# Patient Record
Sex: Male | Born: 1995 | Race: Black or African American | Hispanic: No | Marital: Single | Smoking: Never smoker
Health system: Southern US, Community
[De-identification: ages and names within clinical notes are randomized; demographics above are authoritative.]

## PROBLEM LIST (undated history)

## (undated) DIAGNOSIS — M6282 Rhabdomyolysis: Secondary | ICD-10-CM

---

## 2018-03-25 ENCOUNTER — Encounter (HOSPITAL_COMMUNITY): Payer: Self-pay

## 2018-03-25 ENCOUNTER — Other Ambulatory Visit: Payer: Self-pay

## 2018-03-25 ENCOUNTER — Emergency Department (HOSPITAL_COMMUNITY)
Admission: EM | Admit: 2018-03-25 | Discharge: 2018-03-25 | Disposition: A | Discharge status: Home / Self Care | Payer: BLUE CROSS/BLUE SHIELD | Attending: Emergency Medicine | Admitting: Emergency Medicine

## 2018-03-25 ENCOUNTER — Emergency Department (HOSPITAL_COMMUNITY): Payer: BLUE CROSS/BLUE SHIELD

## 2018-03-25 DIAGNOSIS — R079 Chest pain, unspecified: Secondary | ICD-10-CM | POA: Diagnosis present

## 2018-03-25 DIAGNOSIS — E860 Dehydration: Secondary | ICD-10-CM | POA: Insufficient documentation

## 2018-03-25 DIAGNOSIS — R0789 Other chest pain: Secondary | ICD-10-CM | POA: Insufficient documentation

## 2018-03-25 DIAGNOSIS — Z79899 Other long term (current) drug therapy: Secondary | ICD-10-CM | POA: Insufficient documentation

## 2018-03-25 HISTORY — DX: Rhabdomyolysis: M62.82

## 2018-03-25 LAB — CBC
HCT: 44.4 % (ref 39.0–52.0)
Hemoglobin: 14.3 g/dL (ref 13.0–17.0)
MCH: 28.6 pg (ref 26.0–34.0)
MCHC: 32.2 g/dL (ref 30.0–36.0)
MCV: 88.8 fL (ref 80.0–100.0)
Platelets: 322 10*3/uL (ref 150–400)
RBC: 5 MIL/uL (ref 4.22–5.81)
RDW: 12.1 % (ref 11.5–15.5)
WBC: 7.8 10*3/uL (ref 4.0–10.5)
nRBC: 0 % (ref 0.0–0.2)

## 2018-03-25 LAB — COMPREHENSIVE METABOLIC PANEL
ALT: 54 U/L — ABNORMAL HIGH (ref 0–44)
AST: 40 U/L (ref 15–41)
Albumin: 4.3 g/dL (ref 3.5–5.0)
Alkaline Phosphatase: 68 U/L (ref 38–126)
Anion gap: 13 (ref 5–15)
BUN: 11 mg/dL (ref 6–20)
CO2: 23 mmol/L (ref 22–32)
Calcium: 9.4 mg/dL (ref 8.9–10.3)
Chloride: 102 mmol/L (ref 98–111)
Creatinine, Ser: 1.44 mg/dL — ABNORMAL HIGH (ref 0.61–1.24)
GFR calc Af Amer: >60 mL/min (ref >60)
GFR calc non Af Amer: >60 mL/min (ref >60)
Glucose, Bld: 107 mg/dL — ABNORMAL HIGH (ref 70–99)
POTASSIUM: 3.7 mmol/L (ref 3.5–5.1)
SODIUM: 138 mmol/L (ref 135–145)
Total Bilirubin: 0.9 mg/dL (ref 0.3–1.2)
Total Protein: 7.2 g/dL (ref 6.5–8.1)

## 2018-03-25 LAB — LIPASE, BLOOD: Lipase: 20 U/L (ref 11–51)

## 2018-03-25 LAB — I-STAT TROPONIN, ED: TROPONIN I, POC: 0.01 ng/mL (ref 0.00–0.08)

## 2018-03-25 MED ORDER — OMEPRAZOLE 20 MG PO CPDR: 20.0000 mg | DELAYED_RELEASE_CAPSULE | Freq: Every day | ORAL | 0 refills | Status: AC

## 2018-03-25 MED ORDER — LIDOCAINE VISCOUS HCL 2 % MT SOLN
15.0000 mL | Freq: Once | OROMUCOSAL | Status: AC
  Administered 2018-03-25: 15 mL via ORAL
  Filled 2018-03-25: qty 15

## 2018-03-25 MED ORDER — ALUM & MAG HYDROXIDE-SIMETH 200-200-20 MG/5ML PO SUSP
30.0000 mL | Freq: Once | ORAL | Status: AC
  Administered 2018-03-25: 30 mL via ORAL
  Filled 2018-03-25: qty 30

## 2018-03-25 MED ORDER — SUCRALFATE 1 G PO TABS: 1.0000 g | ORAL_TABLET | Freq: Three times a day (TID) | ORAL | 0 refills | Status: AC

## 2018-03-25 NOTE — ED Provider Notes (Signed)
[facility] EMERGENCY DEPARTMENT Provider Note   CSN: [id number] Arrival date & time: 03/25/18  0010    History   Chief Complaint Chief Complaint  Patient presents with  . Chest Pain    HPI Dustin Lloyd. is a 23 y.o. male.  HPI: A 23 year old patient presents for evaluation of chest pain. Initial onset of pain was approximately 3-6 hours ago. The patient's chest pain is described as heaviness/pressure/tightness and is not worse with exertion. The patient's chest pain is middle- or left-sided, is not well-localized, is not sharp and does not radiate to the arms/jaw/neck. The patient does not complain of nausea and denies diaphoresis. The patient has no history of stroke, has no history of peripheral artery disease, has not smoked in the past 90 days, denies any history of treated diabetes, has no relevant family history of coronary artery disease (first degree relative at less than age 19), is not hypertensive, has no history of hypercholesterolemia and does not have an elevated BMI (>=30).   HPI     Past Medical History:  Diagnosis Date  . Rhabdomyolysis     There are no active problems to display for this patient.   History reviewed. No pertinent surgical history.      Home Medications    Prior to Admission medications   Medication Sig Start Date End Date Taking? Authorizing Provider  loratadine (CLARITIN) 10 MG tablet Take 10 mg by mouth daily as needed for allergies.   Yes [provider]  omeprazole (PRILOSEC) 20 MG capsule Take 1 capsule (20 mg total) by mouth daily for 7 days. 03/25/18 04/01/18  Shaune Pollack, MD  sucralfate (CARAFATE) 1 g tablet Take 1 tablet (1 g total) by mouth 4 (four) times daily -  with meals and at bedtime for 7 days. 03/25/18 04/01/18  Shaune Pollack, MD    Family History No family history on file.  Social History Social History   Tobacco Use  . Smoking status: Never Smoker  . Smokeless tobacco: Never  Used  Substance Use Topics  . Alcohol use: Never    Frequency: Never  . Drug use: Never     Allergies   Patient has no known allergies.   Review of Systems Review of Systems  Constitutional: Positive for fatigue. Negative for chills and fever.  HENT: Negative for congestion and rhinorrhea.   Eyes: Negative for visual disturbance.  Respiratory: Positive for chest tightness. Negative for cough, shortness of breath and wheezing.   Cardiovascular: Positive for chest pain. Negative for leg swelling.  Gastrointestinal: Negative for abdominal pain, diarrhea, nausea and vomiting.  Genitourinary: Negative for dysuria and flank pain.  Musculoskeletal: Negative for neck pain and neck stiffness.  Skin: Negative for rash and wound.  Allergic/Immunologic: Negative for immunocompromised state.  Neurological: Negative for syncope, weakness and headaches.  All other systems reviewed and are negative.    Physical Exam Updated Vital Signs BP 117/63   Pulse 94   Temp 98 F (36.7 C) (Oral)   Resp (!) 29   Ht 5\' 6"  (1.676 m)   Wt 108.9 kg   SpO2 99%   BMI 38.74 kg/m   Physical Exam Vitals signs and nursing note reviewed.  Constitutional:      General: He is not in acute distress.    Appearance: He is well-developed.  HENT:     Head: Normocephalic and atraumatic.  Eyes:     Conjunctiva/sclera: Conjunctivae normal.  Neck:     Musculoskeletal: Neck  supple.  Cardiovascular:     Rate and Rhythm: Normal rate and regular rhythm.     Heart sounds: Normal heart sounds. No murmur. No friction rub.  Pulmonary:     Effort: Pulmonary effort is normal. No respiratory distress.     Breath sounds: Normal breath sounds. No wheezing or rales.  Abdominal:     General: There is no distension.     Palpations: Abdomen is soft.     Tenderness: There is abdominal tenderness in the epigastric area.  Skin:    General: Skin is warm.     Capillary Refill: Capillary refill takes less than 2 seconds.   Neurological:     Mental Status: He is alert and oriented to person, place, and time.     Motor: No abnormal muscle tone.      ED Treatments / Results  Labs (all labs ordered are listed, but only abnormal results are displayed) Labs Reviewed  COMPREHENSIVE METABOLIC PANEL - Abnormal; Notable for the following components:      Result Value   Glucose, Bld 107 (*)    Creatinine, Ser 1.44 (*)    ALT 54 (*)    All other components within normal limits  CBC  LIPASE, BLOOD  I-STAT TROPONIN, ED    EKG EKG Interpretation  Date/Time:  Thursday March 25 2018 00:20:21 EDT Ventricular Rate:  91 PR Interval:    QRS Duration: 108 QT Interval:  344 QTC Calculation: 424 R Axis:   45 Text Interpretation:  Sinus rhythm Borderline T wave abnormalities No old tracing to compare Confirmed by Shaune PollackIsaacs, Analayah Brooke [phone]([id number]) on 03/25/2018 12:23:47 AM   Radiology Dg Chest 2 View  Result Date: 03/25/2018 CLINICAL DATA:  Chest pain EXAM: CHEST - 2 VIEW COMPARISON:  None. FINDINGS: Heart and mediastinal contours are within normal limits. No focal opacities or effusions. No acute bony abnormality. IMPRESSION: No active cardiopulmonary disease. Electronically Signed   By: Charlett NoseKevin  Dover M.D.   On: 03/25/2018 00:59    Procedures Procedures (including critical care time)  Medications Ordered in ED Medications  alum & mag hydroxide-simeth (MAALOX/MYLANTA) 200-200-20 MG/5ML suspension 30 mL (30 mLs Oral Given 03/25/18 0052)    And  lidocaine (XYLOCAINE) 2 % viscous mouth solution 15 mL (15 mLs Oral Given 03/25/18 0052)     Initial Impression / Assessment and Plan / ED Course  I have reviewed the triage vital signs and the nursing notes.  Pertinent labs & imaging results that were available during my care of the patient were reviewed by me and considered in my medical decision making (see chart for details).  Clinical Course as of Mar 25 219  Thu Mar 25, 2018  0201 23 yo M here with mild chest  pain/epigastric pain after eating. Suspect GERD/gastritis. No RUQ TTP or signs of cholecystitis. Lungs CTAB, CXR clear, doubt PNA. He is PERC negative. No hypoxia. EKG non-ischemic, trop neg despite constant sx, doubt ACS. Will f/u labs, tx symptomatically.   [CI]  0207 Pt feels better w/ GI cocktail. Labs reassuring. Doubt ACS, PE. Doubt cholecystitis given exam, normal LFTs. Lipase wnl. Will d/c on antacids. Of note, Cr elevated - I suspect this is baseline but encouraged fluids and will have him f/u with a PCP.   [CI]    Clinical Course User Index [CI] Shaune PollackIsaacs, Bao Bazen, MD    HEAR Score: 1   Final Clinical Impressions(s) / ED Diagnoses   Final diagnoses:  Atypical chest pain  Dehydration    ED  Discharge Orders         Ordered    omeprazole (PRILOSEC) 20 MG capsule  Daily     03/25/18 0221    sucralfate (CARAFATE) 1 g tablet  3 times daily with meals & bedtime     03/25/18 0221           Shaune Pollack, MD 03/25/18 0221

## 2018-03-25 NOTE — Discharge Instructions (Signed)
Start taking the antacid medicines as prescribed  Of note, your labs showed some mild dehydration with a slight bump in your kidney function. It's important to drink plenty of fluids. I'd recommend seeing a primary care doctor sometime soon to follow this up.  Try to avoid aspirin/advil/NSAID medications. Do not drink alcohol.

## 2018-03-25 NOTE — ED Triage Notes (Addendum)
Patient c/o intermittent chest tightness since last night after eating. Patient also reports cough x 3 days. Denies SOB, nausea, vomiting, abdominal pain, dysuria, or fever.

## 2020-03-31 IMAGING — DX CHEST - 2 VIEW
2 series · 2 of 2 positions shown · non-contrast
Comparison: None.

CLINICAL DATA: Chest pain

EXAM:
CHEST - 2 VIEW

[chest pa]
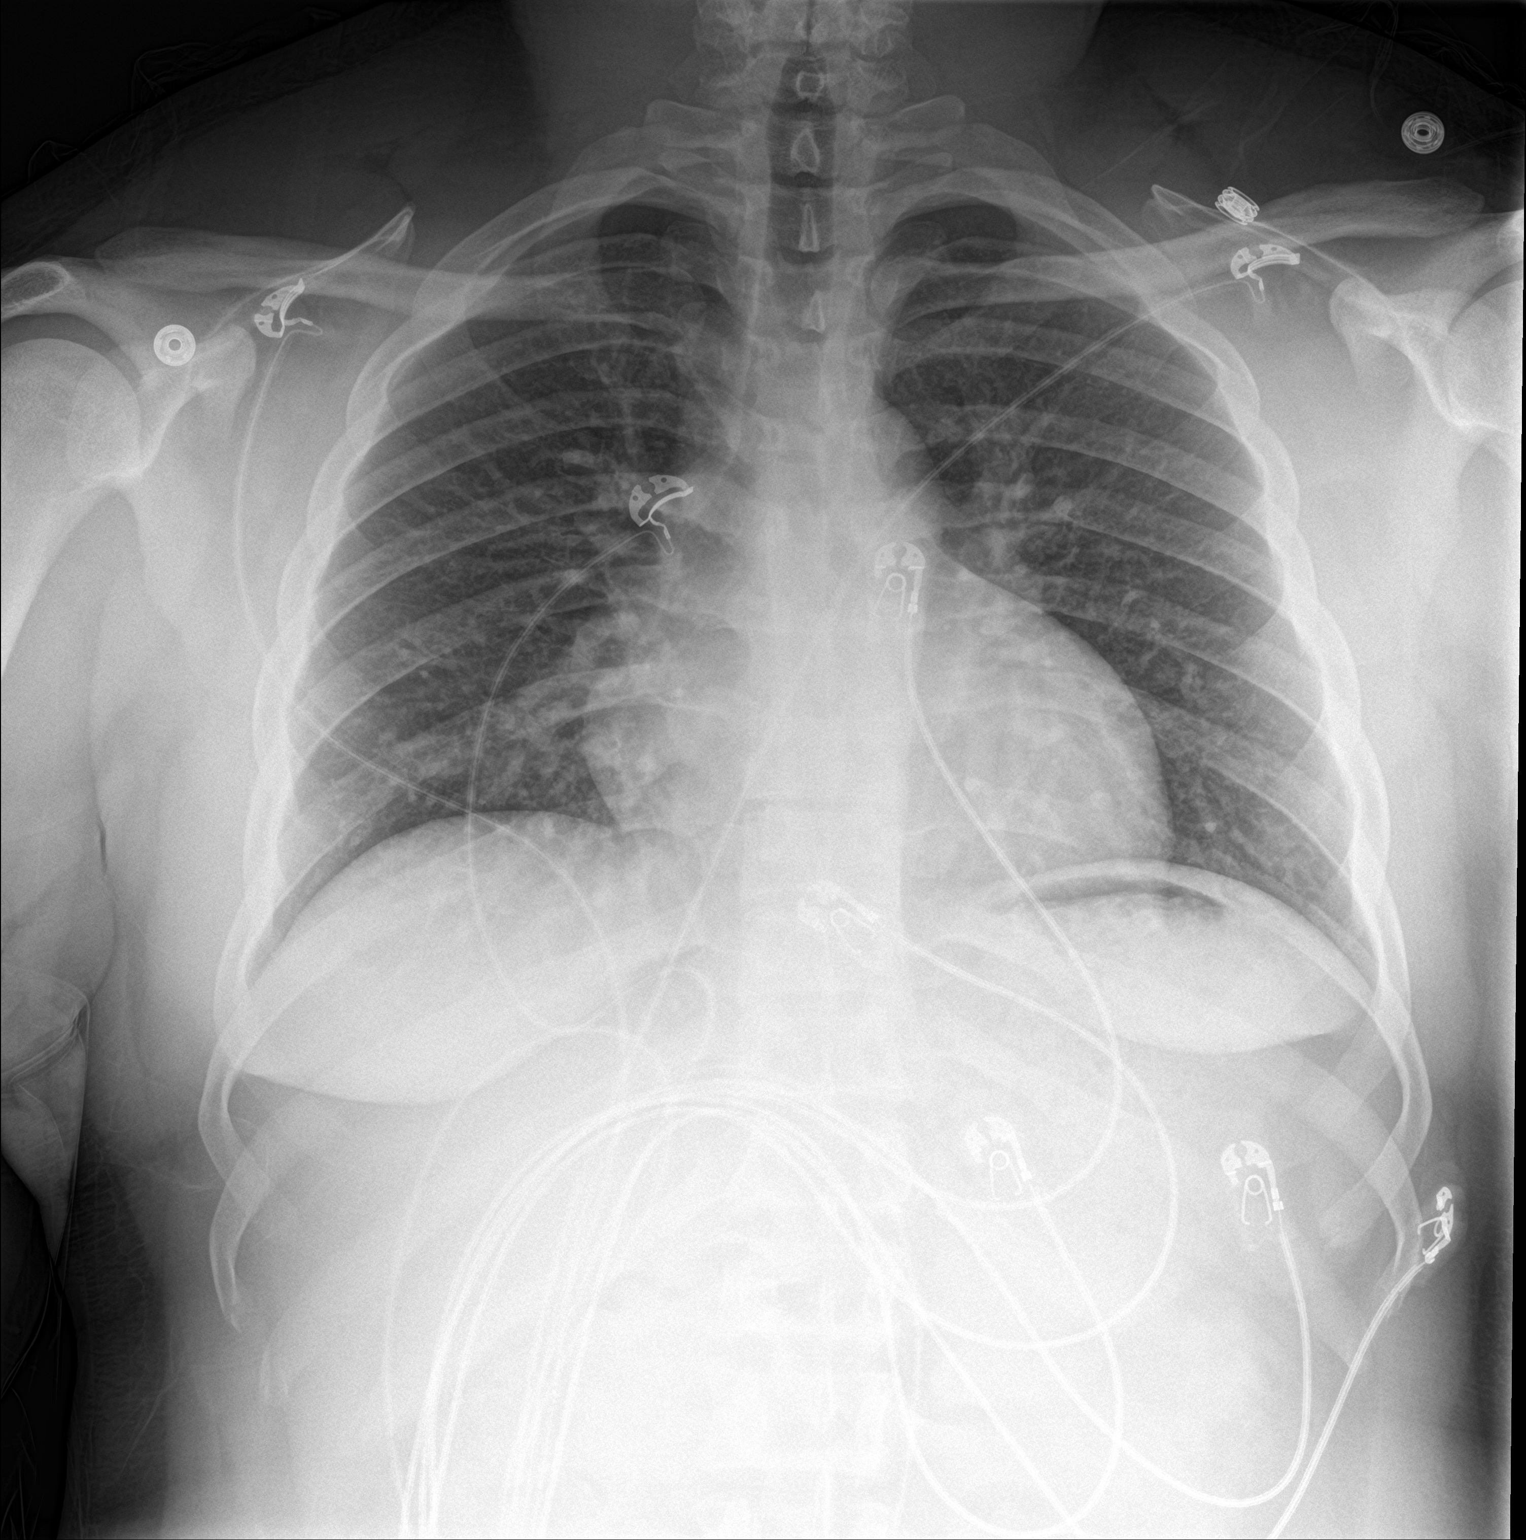

[chest lat]
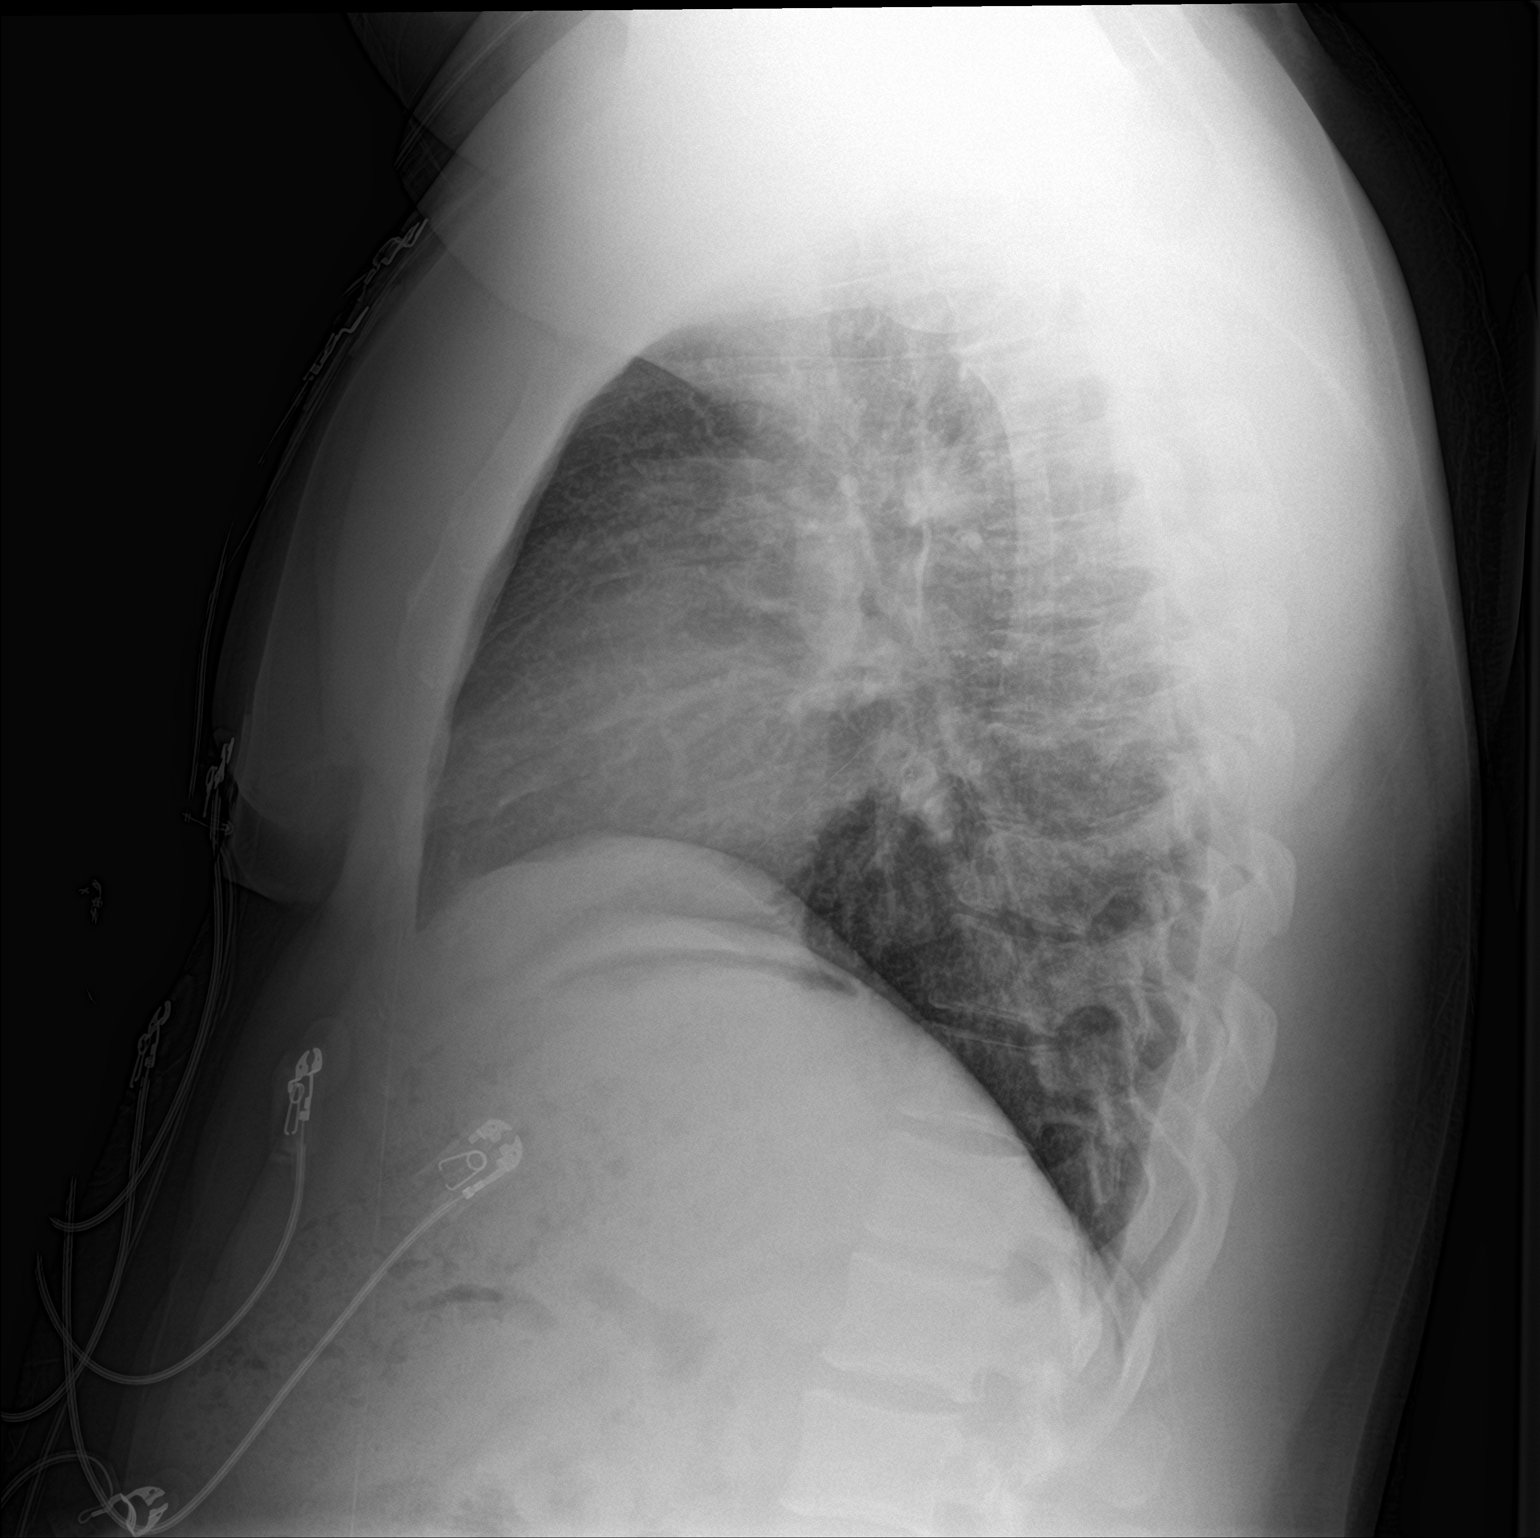

[2 of 2 positions shown; findings below may reference images not displayed]

FINDINGS: Heart and mediastinal contours are within normal limits. No focal
opacities or effusions. No acute bony abnormality.
IMPRESSION: No active cardiopulmonary disease.
